# Patient Record
Sex: Female | Born: 1950 | Race: Black or African American | Hispanic: No | Smoking: Never smoker
Health system: Southern US, Community
[De-identification: ages and names within clinical notes are randomized; demographics above are authoritative.]

## PROBLEM LIST (undated history)

## (undated) DIAGNOSIS — E7800 Pure hypercholesterolemia, unspecified: Secondary | ICD-10-CM

## (undated) DIAGNOSIS — M1990 Unspecified osteoarthritis, unspecified site: Secondary | ICD-10-CM

## (undated) HISTORY — PX: NO PAST SURGERIES: SHX2092

---

## 2012-05-09 ENCOUNTER — Encounter (HOSPITAL_COMMUNITY): Payer: Self-pay | Admitting: Anesthesiology

## 2012-05-09 ENCOUNTER — Ambulatory Visit (HOSPITAL_COMMUNITY): Payer: Worker's Compensation | Admitting: Anesthesiology

## 2012-05-09 ENCOUNTER — Ambulatory Visit (HOSPITAL_COMMUNITY)
Admission: AD | Admit: 2012-05-09 | Discharge: 2012-05-09 | Disposition: A | Discharge status: Home / Self Care | Payer: Worker's Compensation | Source: Ambulatory Visit | Attending: Orthopedic Surgery | Admitting: Orthopedic Surgery

## 2012-05-09 ENCOUNTER — Encounter (HOSPITAL_COMMUNITY): Payer: Self-pay | Admitting: *Deleted

## 2012-05-09 ENCOUNTER — Encounter (HOSPITAL_COMMUNITY)
Admission: AD | Disposition: A | Discharge status: Home / Self Care | Payer: Self-pay | Source: Ambulatory Visit | Attending: Orthopedic Surgery

## 2012-05-09 DIAGNOSIS — W07XXXA Fall from chair, initial encounter: Secondary | ICD-10-CM | POA: Insufficient documentation

## 2012-05-09 DIAGNOSIS — Y9229 Other specified public building as the place of occurrence of the external cause: Secondary | ICD-10-CM | POA: Insufficient documentation

## 2012-05-09 DIAGNOSIS — S52502A Unspecified fracture of the lower end of left radius, initial encounter for closed fracture: Secondary | ICD-10-CM

## 2012-05-09 DIAGNOSIS — S52599A Other fractures of lower end of unspecified radius, initial encounter for closed fracture: Secondary | ICD-10-CM | POA: Insufficient documentation

## 2012-05-09 HISTORY — PX: ORIF WRIST FRACTURE: SHX2133

## 2012-05-09 HISTORY — DX: Unspecified osteoarthritis, unspecified site: M19.90

## 2012-05-09 LAB — BASIC METABOLIC PANEL
CO2: 25 mEq/L (ref 19–32)
Glucose, Bld: 97 mg/dL (ref 70–99)
Potassium: 3.7 mEq/L (ref 3.5–5.1)
Sodium: 138 mEq/L (ref 135–145)

## 2012-05-09 LAB — SURGICAL PCR SCREEN: Staphylococcus aureus: NEGATIVE

## 2012-05-09 LAB — CBC
HCT: 40.5 % (ref 36.0–46.0)
Hemoglobin: 13.8 g/dL (ref 12.0–15.0)
RBC: 4.42 MIL/uL (ref 3.87–5.11)

## 2012-05-09 SURGERY — OPEN REDUCTION INTERNAL FIXATION (ORIF) WRIST FRACTURE
Anesthesia: General | Site: Wrist | Laterality: Left | Wound class: Clean

## 2012-05-09 MED ORDER — FENTANYL CITRATE 0.05 MG/ML IJ SOLN
INTRAMUSCULAR | Status: DC | PRN
  Administered 2012-05-09 (×5): 50 ug via INTRAVENOUS

## 2012-05-09 MED ORDER — ACETAMINOPHEN 10 MG/ML IV SOLN
INTRAVENOUS | Status: AC
  Filled 2012-05-09: qty 100

## 2012-05-09 MED ORDER — LACTATED RINGERS IV SOLN
INTRAVENOUS | Status: DC | PRN
  Administered 2012-05-09 (×2): via INTRAVENOUS

## 2012-05-09 MED ORDER — BUPIVACAINE HCL (PF) 0.5 % IJ SOLN
INTRAMUSCULAR | Status: AC
  Filled 2012-05-09: qty 30

## 2012-05-09 MED ORDER — LACTATED RINGERS IV SOLN
INTRAVENOUS | Status: DC
  Administered 2012-05-09: 11:00:00 via INTRAVENOUS

## 2012-05-09 MED ORDER — DEXAMETHASONE SODIUM PHOSPHATE 10 MG/ML IJ SOLN
INTRAMUSCULAR | Status: DC | PRN
  Administered 2012-05-09: 8 mg via INTRAVENOUS

## 2012-05-09 MED ORDER — CEFAZOLIN SODIUM-DEXTROSE 2-3 GM-% IV SOLR
INTRAVENOUS | Status: AC
  Filled 2012-05-09: qty 50

## 2012-05-09 MED ORDER — MIDAZOLAM HCL 5 MG/5ML IJ SOLN
INTRAMUSCULAR | Status: DC | PRN
  Administered 2012-05-09: 2 mg via INTRAVENOUS

## 2012-05-09 MED ORDER — CEFAZOLIN SODIUM-DEXTROSE 2-3 GM-% IV SOLR
2.0000 g | INTRAVENOUS | Status: AC
  Administered 2012-05-09: 2 g via INTRAVENOUS

## 2012-05-09 MED ORDER — OXYCODONE HCL 5 MG/5ML PO SOLN: 5.0000 mg | Freq: Once | ORAL | Status: DC | PRN

## 2012-05-09 MED ORDER — MUPIROCIN 2 % EX OINT
TOPICAL_OINTMENT | CUTANEOUS | Status: AC
  Filled 2012-05-09: qty 22

## 2012-05-09 MED ORDER — OXYCODONE HCL 5 MG PO TABS: 5.0000 mg | ORAL_TABLET | Freq: Once | ORAL | Status: DC | PRN

## 2012-05-09 MED ORDER — ACETAMINOPHEN 10 MG/ML IV SOLN
1000.0000 mg | Freq: Once | INTRAVENOUS | Status: AC
  Administered 2012-05-09: 1000 mg via INTRAVENOUS

## 2012-05-09 MED ORDER — DEXTROSE-NACL 5-0.45 % IV SOLN: INTRAVENOUS | Status: DC

## 2012-05-09 MED ORDER — PROPOFOL 10 MG/ML IV BOLUS
INTRAVENOUS | Status: DC | PRN
  Administered 2012-05-09: 200 mg via INTRAVENOUS

## 2012-05-09 MED ORDER — HYDROMORPHONE HCL PF 1 MG/ML IJ SOLN
INTRAMUSCULAR | Status: AC
  Filled 2012-05-09: qty 1

## 2012-05-09 MED ORDER — ONDANSETRON HCL 4 MG/2ML IJ SOLN
INTRAMUSCULAR | Status: DC | PRN
  Administered 2012-05-09: 4 mg via INTRAVENOUS

## 2012-05-09 MED ORDER — LIDOCAINE HCL (CARDIAC) 20 MG/ML IV SOLN
INTRAVENOUS | Status: DC | PRN
  Administered 2012-05-09: 100 mg via INTRAVENOUS

## 2012-05-09 MED ORDER — METOCLOPRAMIDE HCL 5 MG/ML IJ SOLN: 10.0000 mg | Freq: Once | INTRAMUSCULAR | Status: DC | PRN

## 2012-05-09 MED ORDER — 0.9 % SODIUM CHLORIDE (POUR BTL) OPTIME
TOPICAL | Status: DC | PRN
  Administered 2012-05-09: 1000 mL

## 2012-05-09 MED ORDER — HYDROMORPHONE HCL PF 1 MG/ML IJ SOLN
0.2500 mg | INTRAMUSCULAR | Status: DC | PRN
  Administered 2012-05-09 (×2): 0.5 mg via INTRAVENOUS

## 2012-05-09 MED ORDER — CHLORHEXIDINE GLUCONATE 4 % EX LIQD: 60.0000 mL | Freq: Once | CUTANEOUS | Status: DC

## 2012-05-09 MED ORDER — BUPIVACAINE HCL 0.5 % IJ SOLN
INTRAMUSCULAR | Status: DC | PRN
  Administered 2012-05-09: 7 mL

## 2012-05-09 SURGICAL SUPPLY — 55 items
BANDAGE ELASTIC 3 VELCRO ST LF (GAUZE/BANDAGES/DRESSINGS) IMPLANT
BANDAGE ELASTIC 4 VELCRO ST LF (GAUZE/BANDAGES/DRESSINGS) IMPLANT
BANDAGE GAUZE ELAST BULKY 4 IN (GAUZE/BANDAGES/DRESSINGS) IMPLANT
BENZOIN TINCTURE PRP APPL 2/3 (GAUZE/BANDAGES/DRESSINGS) IMPLANT
BIT DRILL 2 FAST STEP (BIT) ×2 IMPLANT
BIT DRILL 2.5X4 QC (BIT) ×2 IMPLANT
BNDG ELASTIC 2 VLCR STRL LF (GAUZE/BANDAGES/DRESSINGS) IMPLANT
BNDG ESMARK 4X9 LF (GAUZE/BANDAGES/DRESSINGS) ×2 IMPLANT
CLOTH BEACON ORANGE TIMEOUT ST (SAFETY) ×2 IMPLANT
CUFF TOURNIQUET SINGLE 18IN (TOURNIQUET CUFF) IMPLANT
CUFF TOURNIQUET SINGLE 24IN (TOURNIQUET CUFF) IMPLANT
DRAPE OEC MINIVIEW 54X84 (DRAPES) ×2 IMPLANT
DURAPREP 26ML APPLICATOR (WOUND CARE) ×2 IMPLANT
ELECT NEEDLE TIP 2.8 STRL (NEEDLE) ×2 IMPLANT
ELECT REM PT RETURN 9FT ADLT (ELECTROSURGICAL) ×2
ELECTRODE REM PT RTRN 9FT ADLT (ELECTROSURGICAL) ×1 IMPLANT
GAUZE XEROFORM 1X8 LF (GAUZE/BANDAGES/DRESSINGS) ×2 IMPLANT
GLOVE BIO SURGEON STRL SZ7 (GLOVE) ×2 IMPLANT
GLOVE BIO SURGEON STRL SZ7.5 (GLOVE) ×2 IMPLANT
GLOVE BIOGEL PI IND STRL 7.5 (GLOVE) ×1 IMPLANT
GLOVE BIOGEL PI IND STRL 8 (GLOVE) ×1 IMPLANT
GLOVE BIOGEL PI INDICATOR 7.5 (GLOVE) ×1
GLOVE BIOGEL PI INDICATOR 8 (GLOVE) ×1
K-WIRE 1.6 (WIRE) ×2
K-WIRE FX5X1.6XNS BN SS (WIRE) ×2
KIT BASIN OR (CUSTOM PROCEDURE TRAY) ×2 IMPLANT
KIT ROOM TURNOVER OR (KITS) ×2 IMPLANT
KWIRE FX5X1.6XNS BN SS (WIRE) ×2 IMPLANT
NEEDLE 22X1 1/2 (OR ONLY) (NEEDLE) IMPLANT
PACK ORTHO EXTREMITY (CUSTOM PROCEDURE TRAY) ×2 IMPLANT
PAD ARMBOARD 7.5X6 YLW CONV (MISCELLANEOUS) ×4 IMPLANT
PAD CAST 3X4 CTTN HI CHSV (CAST SUPPLIES) IMPLANT
PAD CAST 4YDX4 CTTN HI CHSV (CAST SUPPLIES) IMPLANT
PADDING CAST COTTON 3X4 STRL (CAST SUPPLIES)
PADDING CAST COTTON 4X4 STRL (CAST SUPPLIES)
PADDING UNDERCAST 2  STERILE (CAST SUPPLIES) IMPLANT
PEG FULLY THREADED 2.5X22MM (Peg) ×4 IMPLANT
PEG SUBCHONDRAL SMOOTH 2.0X16 (Peg) ×2 IMPLANT
PEG SUBCHONDRAL SMOOTH 2.0X20 (Peg) ×4 IMPLANT
PEG SUBCHONDRAL SMOOTH 2.0X22 (Peg) ×4 IMPLANT
PLATE SHORT 24.4X51.3 LT (Plate) ×2 IMPLANT
SCREW BN 12X3.5XNS CORT TI (Screw) ×2 IMPLANT
SCREW CORT 3.5X12 (Screw) ×2 IMPLANT
SPONGE GAUZE 4X4 12PLY (GAUZE/BANDAGES/DRESSINGS) ×2 IMPLANT
SPONGE LAP 4X18 X RAY DECT (DISPOSABLE) ×2 IMPLANT
STRIP CLOSURE SKIN 1/2X4 (GAUZE/BANDAGES/DRESSINGS) IMPLANT
SUCTION FRAZIER TIP 10 FR DISP (SUCTIONS) ×2 IMPLANT
SUT ETHILON 4 0 PS 2 18 (SUTURE) IMPLANT
SUT VIC AB 2-0 CTB1 (SUTURE) IMPLANT
SUT VIC AB 3-0 FS2 27 (SUTURE) IMPLANT
SYR CONTROL 10ML LL (SYRINGE) IMPLANT
TOWEL OR 17X24 6PK STRL BLUE (TOWEL DISPOSABLE) ×2 IMPLANT
TOWEL OR 17X26 10 PK STRL BLUE (TOWEL DISPOSABLE) ×2 IMPLANT
TUBE CONNECTING 12X1/4 (SUCTIONS) ×2 IMPLANT
UNDERPAD 30X30 INCONTINENT (UNDERPADS AND DIAPERS) ×2 IMPLANT

## 2012-05-09 NOTE — Progress Notes (Signed)
Orthopedic Tech Progress Note Patient Details:  Ellen Bradshaw 1950/05/18 [id number] Arm sling ordered and delivered to PACU nurse for patient to take home for discharge. Ortho Devices Type of Ortho Device: Arm sling Ortho Device/Splint Interventions: Ordered   [place] R Thompson 05/09/2012, 2:58 PM

## 2012-05-09 NOTE — Anesthesia Procedure Notes (Signed)
Procedure Name: LMA Insertion Date/Time: 05/09/2012 11:33 AM Performed by: Romie Minus K Pre-anesthesia Checklist: Patient identified, Emergency Drugs available, Suction available, Patient being monitored and Timeout performed Patient Re-evaluated:Patient Re-evaluated prior to inductionOxygen Delivery Method: Circle system utilized Preoxygenation: Pre-oxygenation with 100% oxygen Intubation Type: IV induction Ventilation: Mask ventilation without difficulty LMA: LMA inserted LMA Size: 4.0 Number of attempts: 1 Placement Confirmation: positive ETCO2,  CO2 detector and breath sounds checked- equal and bilateral Tube secured with: Tape Dental Injury: Teeth and Oropharynx as per pre-operative assessment

## 2012-05-09 NOTE — Transfer of Care (Signed)
Immediate Anesthesia Transfer of Care Note  Patient: Ellen Bradshaw  Procedure(s) Performed: Procedure(s) (LRB) with comments: OPEN REDUCTION INTERNAL FIXATION (ORIF) WRIST FRACTURE (Left)  Patient Location: PACU  Anesthesia Type:General  Level of Consciousness: oriented, sedated and patient cooperative  Airway & Oxygen Therapy: Patient Spontanous Breathing and Patient connected to nasal cannula oxygen  Post-op Assessment: Report given to PACU RN and Post -op Vital signs reviewed and stable  Post vital signs: Reviewed and stable  Complications: No apparent anesthesia complications

## 2012-05-09 NOTE — H&P (Signed)
Ellen Bradshaw is an 62 y.o. female.   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  She injured her left wrist and low back.  She was seen at urgent care, where xrays showed a distal radius radius fx.    Past Medical Hx: Back pain  Past Surgical Hx: None  Family Hx: DM, HTN, CAD  Social Hx: Denies use of tobacco, occasional alcohol.  No past medical history on file.  No past surgical history on file.  No family history on file. Social History:  does not have a smoking history on file. She does not have any smokeless tobacco history on file. Her alcohol and drug histories not on file.  Allergies: Allergies not on file  No prescriptions prior to admission    No results found for this or any previous visit (from the past 48 hour(s)). No results found.  Review of Systems  Constitutional: Negative.   HENT: Negative.   Eyes: Negative.   Respiratory: Negative.   Cardiovascular: Negative.   Gastrointestinal: Negative.   Genitourinary: Negative.   Musculoskeletal: Positive for back pain and joint pain.  Skin: Negative.   Neurological: Negative.   Endo/Heme/Allergies: Bruises/bleeds easily.  Psychiatric/Behavioral: Negative.     There were no vitals taken for this visit. Physical Exam  Constitutional: She is oriented to person, place, and time. She appears well-developed and well-nourished.  HENT:  Head: Normocephalic.  Cardiovascular: Normal heart sounds.   Respiratory: Breath sounds normal.  GI: Bowel sounds are normal.  Musculoskeletal:       Left wrist: She exhibits decreased range of motion, tenderness, swelling and deformity.  Neurological: She is alert and oriented to person, place, and time.    Xrays: 3 views of the left wrist demonstrate a fx of the distal radius with shortening and 30 degrees apex volar angulation.  Assessment/Plan Left distal radius Fx Plan:  Open reduction  internal fixation of the left wrist was discussed with the patient in detail.  She understands the risks and benefits of surgery and would like to proceed.  Ellen Bradshaw 05/09/2012, 7:36 AM

## 2012-05-09 NOTE — Anesthesia Preprocedure Evaluation (Addendum)
Anesthesia Evaluation  Patient identified by MRN, date of birth, ID band Patient awake    Reviewed: Allergy & Precautions, H&P , NPO status , Patient's Chart, lab work & pertinent test results, reviewed documented beta blocker date and time   History of Anesthesia Complications Negative for: history of anesthetic complications  Airway Mallampati: II TM Distance: >3 FB Neck ROM: full    Dental  (+) Teeth Intact, Partial Lower and Dental Advisory Given   Pulmonary neg pulmonary ROS,  breath sounds clear to auscultation        Cardiovascular negative cardio ROS  Rhythm:regular     Neuro/Psych negative neurological ROS  negative psych ROS   GI/Hepatic negative GI ROS, Neg liver ROS,   Endo/Other  negative endocrine ROS  Renal/GU negative Renal ROS  negative genitourinary   Musculoskeletal   Abdominal   Peds  Hematology negative hematology ROS (+)   Anesthesia Other Findings See surgeon's H&P   Reproductive/Obstetrics negative OB ROS                          Anesthesia Physical Anesthesia Plan  ASA: II  Anesthesia Plan: General   Post-op Pain Management:    Induction: Intravenous  Airway Management Planned: LMA  Additional Equipment:   Intra-op Plan:   Post-operative Plan: Extubation in OR  Informed Consent: I have reviewed the patients History and Physical, chart, labs and discussed the procedure including the risks, benefits and alternatives for the proposed anesthesia with the patient or authorized representative who has indicated his/her understanding and acceptance.   Dental Advisory Given  Plan Discussed with: CRNA and Surgeon  Anesthesia Plan Comments:         Anesthesia Quick Evaluation

## 2012-05-09 NOTE — Op Note (Signed)
05/09/2012  12:34 PM  PATIENT:  Ellen Bradshaw  62 y.o. 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 In order to restore function and decrease pain she is taken for open reduction and internal fixation risks and benefits have been discussed at length with the patient.  Description of procedure: Patient identified by arm band receive preoperative IV antibiotics in the holding area. She was taken to the operating room, the appropriate anesthetic monitors were attached and general LMA anesthesia induced. A tourniquet was applied high to the left forearm and the left upper Charney prepped and draped in the usual sterile fashion from the fingertips to the tourniquet. A timeout procedure was performed. The limb was wrapped with an Esmarch bandage and the tourniquet inflated to 250 mm of mercury. A 10 cm longitudinal insertion starting at the insertion of the flexor carpi radialis tendon and going proximally was made through the skin and subcutaneous tissue down to the sheath over the tendon. The sheath was incised and the right tendon reflected laterally the dorsal sheath was then incised taking [place] down to the pronator quadratus which was then reflected off of its insertion on the lateral edge of the radius. This exposed the fracture and a Chung retractor was placed  to improve visualization. Using a combination of traction and volar angulation the fracture was reduced and C-arm images were taken confirming that near-anatomic reduction. A small left DVR plate was then provisionally fixed to the proximal part of the radius using a 12 mm glide screw , and K wires placed to the into the distal fracture for provisional fixation of the DVR plate. Near-anatomic reduction was confirmed on C-arm imaging. We then placed a combination of screw pegs and fully threaded screws into the distal aspect of the DVR plate obtaining good firm fixation of the distal fragment using a total of 6 screws. 2 more lag screws were placed in the proximal stem of the plate completing the fixation procedure. The tourniquet was let down the wound irrigated with normal saline solution small bleeders were identified and cauterized. We then closed in layers with 3-0 subcutaneous running Vicryl suture, 3-0 subcuticular running Vicryl suture. A dressing of Xerofoam 4 x 4 dressing sponges web roll Ace wrap and a Velfoam splint were then applied. The patient was awakened extubated and taken to the recovery without difficulty. The plan is for same-day discharge.  [name] 05/09/2012, 12:34 PM

## 2012-05-09 NOTE — Preoperative (Signed)
Beta Blockers   Reason not to administer Beta Blockers:Not Applicable 

## 2012-05-09 NOTE — Anesthesia Postprocedure Evaluation (Signed)
Anesthesia Post Note  Patient: Ellen Bradshaw  Procedure(s) Performed: Procedure(s) (LRB): OPEN REDUCTION INTERNAL FIXATION (ORIF) WRIST FRACTURE (Left)  Anesthesia type: general  Patient location: PACU  Post pain: Pain level controlled  Post assessment: Patient's Cardiovascular Status Stable  Last Vitals:  Filed Vitals:   05/09/12 1444  BP:   Pulse:   Temp: 36.7 C  Resp:     Post vital signs: Reviewed and stable  Level of consciousness: sedated  Complications: No apparent anesthesia complications

## 2012-05-09 NOTE — Interval H&P Note (Signed)
History and Physical Interval Note:  05/09/2012 11:10 AM  Ellen Bradshaw  has presented today for surgery, with the diagnosis of left distal radius fracture  The various methods of treatment have been discussed with the patient and family. After consideration of risks, benefits and other options for treatment, the patient has consented to  Procedure(s) (LRB) with comments: OPEN REDUCTION INTERNAL FIXATION (ORIF) WRIST FRACTURE (Left) as a surgical intervention .  The patient's history has been reviewed, patient examined, no change in status, stable for surgery.  I have reviewed the patient's chart and labs.  Questions were answered to the patient's satisfaction.     [name]

## 2012-05-12 ENCOUNTER — Encounter (HOSPITAL_COMMUNITY): Payer: Self-pay | Admitting: Orthopedic Surgery

## 2015-05-16 ENCOUNTER — Encounter (HOSPITAL_BASED_OUTPATIENT_CLINIC_OR_DEPARTMENT_OTHER): Payer: Self-pay | Admitting: *Deleted

## 2015-05-16 ENCOUNTER — Emergency Department (HOSPITAL_BASED_OUTPATIENT_CLINIC_OR_DEPARTMENT_OTHER)
Admission: EM | Admit: 2015-05-16 | Discharge: 2015-05-16 | Disposition: A | Discharge status: Home / Self Care | Payer: BC Managed Care – PPO | Attending: Emergency Medicine | Admitting: Emergency Medicine

## 2015-05-16 DIAGNOSIS — R21 Rash and other nonspecific skin eruption: Secondary | ICD-10-CM | POA: Insufficient documentation

## 2015-05-16 NOTE — ED Notes (Signed)
Notified by reg clerk that pt stated rash was better and she was leaving

## 2015-05-16 NOTE — ED Notes (Signed)
Pt began having itching rash on back and today at work her itching got worse and she has hives on her neck, face, chest and back as well as face.  Pt is unsure what could be causing this, no change in soap or detergent recently.

## 2017-04-12 ENCOUNTER — Emergency Department (HOSPITAL_BASED_OUTPATIENT_CLINIC_OR_DEPARTMENT_OTHER)
Admission: EM | Admit: 2017-04-12 | Discharge: 2017-04-12 | Disposition: A | Discharge status: Home / Self Care | Payer: Worker's Compensation | Attending: Emergency Medicine | Admitting: Emergency Medicine

## 2017-04-12 ENCOUNTER — Encounter (HOSPITAL_BASED_OUTPATIENT_CLINIC_OR_DEPARTMENT_OTHER): Payer: Self-pay | Admitting: Emergency Medicine

## 2017-04-12 ENCOUNTER — Emergency Department (HOSPITAL_BASED_OUTPATIENT_CLINIC_OR_DEPARTMENT_OTHER): Payer: Worker's Compensation

## 2017-04-12 ENCOUNTER — Other Ambulatory Visit: Payer: Self-pay

## 2017-04-12 DIAGNOSIS — R51 Headache: Secondary | ICD-10-CM | POA: Diagnosis not present

## 2017-04-12 DIAGNOSIS — W19XXXA Unspecified fall, initial encounter: Secondary | ICD-10-CM

## 2017-04-12 DIAGNOSIS — M25552 Pain in left hip: Secondary | ICD-10-CM | POA: Insufficient documentation

## 2017-04-12 DIAGNOSIS — M25512 Pain in left shoulder: Secondary | ICD-10-CM | POA: Insufficient documentation

## 2017-04-12 DIAGNOSIS — R519 Headache, unspecified: Secondary | ICD-10-CM

## 2017-04-12 HISTORY — DX: Pure hypercholesterolemia, unspecified: E78.00

## 2017-04-12 MED ORDER — ONDANSETRON 4 MG PO TBDP
4.0000 mg | ORAL_TABLET | Freq: Once | ORAL | Status: AC
  Administered 2017-04-12: 4 mg via ORAL
  Filled 2017-04-12: qty 1

## 2017-04-12 MED ORDER — ACETAMINOPHEN 325 MG PO TABS
650.0000 mg | ORAL_TABLET | Freq: Once | ORAL | Status: AC
  Administered 2017-04-12: 650 mg via ORAL
  Filled 2017-04-12: qty 2

## 2017-04-12 MED ORDER — METHOCARBAMOL 500 MG PO TABS: 500.0000 mg | ORAL_TABLET | Freq: Two times a day (BID) | ORAL | 0 refills | Status: AC

## 2017-04-12 NOTE — Discharge Instructions (Signed)
Your imaging is obtained in the ED today was reassuring.  No signs of fractured bones or bleeding in your head.  You may have sustained a concussion with your fall. Concussion Hotline [phone]).  Have also given you a referral to orthopedic if your left hip and left shoulder pain is not improving.  Symptomatic treatment with NSAIDs including Aleve or ibuprofen.  May also take Tylenol.  Alternate warm and cold compresses to the area. Please rest, ice, compress and elevated the affected body part to help with swelling and pain. Please the Robaxin for muscle relaxation. This medication will make you drowsy so avoid situation that could place you in danger.    You have been diagnosed with a concussion.  Ibuprofen or Tylenol for pain Rest, ice on head.  Stay in a quiet, non-simulating, dark environment.  Follow up with your primary care physician if headache persists.  Return to the emergency department if patient becomes lethargic, begins vomiting or other change in mental status.  HEAD INJURY If any of the following occur notify your physician or go to the Hospital Emergency Department:  Increased drowsiness, stupor or loss of consciousness  Temperature above 100 F  Vomiting  Severe headache  Blood or clear fluid dripping from the nose or ears  Stiffness of the neck  Dizziness or blurred vision  Any other unusual symptoms  PRECAUTIONS  Keep head elevated at all times for the first 24 hours (Elevate mattress if pillow is ineffective)  Do not take sedatives, narcotics or alcohol  Avoid aspirin. Use only acetaminophen (e.g. Tylenol) or ibuprofen (e.g. Advil) for relief of pain. Follow directions on the bottle for dosage.  Use ice packs for comfort

## 2017-04-12 NOTE — ED Provider Notes (Signed)
[facility] EMERGENCY DEPARTMENT Provider Note   CSN: [id number] Arrival date & time: 04/12/17  1033     History   Chief Complaint Chief Complaint  Patient presents with  . Fall    HPI Ellen Bradshaw is a 67 y.o. female.  HPI 67 year old African-American female with no pertinent past medical history presents to the emergency department today for evaluation of left shoulder pain and left hip pain along with headache following mechanical fall prior to arrival.  Patient states that she was walking into work when she tripped on the water and fell onto her left shoulder.  She also reports hitting her left hip on the ground.  Patient denies head injury or LOC.  She states the pain in her left shoulder is worse with palpation and range of motion.  Patient reports pain in her left hip with palpation but is able to ambulate with normal gait and without pain.  Patient denies any associated knee pain, abdominal pain, chest pain.  She does report some left lateral neck pain.  She reports a headache and feeling "foggy".  She denies any lightheadedness, dizziness, LOC, visual changes, photophobia.  She does report some mild nausea but denies he is not take anything for the pain prior to arrival.  Nothing makes her symptoms better. Past Medical History:  Diagnosis Date  . Arthritis   . Hypercholesteremia     There are no active problems to display for this patient.   Past Surgical History:  Procedure Laterality Date  . NO PAST SURGERIES    . ORIF WRIST FRACTURE  05/09/2012   Procedure: OPEN REDUCTION INTERNAL FIXATION (ORIF) WRIST FRACTURE;  Surgeon: [name], MD;  Location: [facility] OR;  Service: Orthopedics;  Laterality: Left;    OB History    No data available       Home Medications    Prior to Admission medications   Medication Sig Start Date End Date Taking? Authorizing Provider  meloxicam (MOBIC) 15 MG tablet Take 15 mg by mouth at bedtime as needed for pain.   Yes  [provider]    Family History No family history on file.  Social History Social History   Tobacco Use  . Smoking status: Never Smoker  . Smokeless tobacco: Never Used  Substance Use Topics  . Alcohol use: Yes  . Drug use: No     Allergies   Patient has no known allergies.   Review of Systems Review of Systems  Constitutional: Negative for chills and fever.  Eyes: Negative for photophobia and visual disturbance.  Respiratory: Negative for shortness of breath.   Cardiovascular: Negative for chest pain.  Gastrointestinal: Positive for nausea. Negative for abdominal pain and vomiting.  Musculoskeletal: Positive for arthralgias, joint swelling, myalgias, neck pain and neck stiffness. Negative for back pain and gait problem.  Skin: Negative for color change and rash.  Neurological: Positive for headaches. Negative for dizziness, weakness, light-headedness and numbness.     Physical Exam Updated Vital Signs BP (!) 164/82 (BP Location: Right Arm)   Pulse 62   Temp 98.7 F (37.1 C) (Oral)   Resp 18   Ht 5\' 2"  (1.575 m)   Wt 83.9 kg (185 lb)   SpO2 100%   BMI 33.84 kg/m   Physical Exam Physical Exam  Constitutional: Pt is oriented to person, place, and time. Appears well-developed and well-nourished. No distress.  HENT:  Head: Normocephalic and atraumatic.  No raccoon eyes or battle sign. Ears: No bilateral  hemotympanum. Nose: Nose normal. No septal hematoma. Mouth/Throat: Uvula is midline, oropharynx is clear and moist and mucous membranes are normal.  Eyes: Conjunctivae and EOM are normal. Pupils are equal, round, and reactive to light.  Neck: No spinous process tenderness and no muscular tenderness present. No rigidity. Normal range of motion present.  Full ROM without pain No midline cervical tenderness No crepitus, deformity or step-offs Left paraspinal tenderness  Cardiovascular: Normal rate, regular rhythm and intact distal pulses.   Pulses:       Radial pulses are 2+ on the right side, and 2+ on the left side.       Dorsalis pedis pulses are 2+ on the right side, and 2+ on the left side.       Posterior tibial pulses are 2+ on the right side, and 2+ on the left side.  Pulmonary/Chest: Effort normal and breath sounds normal. No accessory muscle usage. No respiratory distress. No decreased breath sounds. No wheezes. No rhonchi. No rales. Exhibits no tenderness and no bony tenderness.  No flail segment, crepitus or deformity Equal chest expansion  Abdominal: Soft. Normal appearance and bowel sounds are normal. There is no tenderness. There is no rigidity, no guarding and no CVA tenderness.  Abd soft and nontender  Musculoskeletal: Normal range of motion.       Thoracic back: Exhibits normal range of motion.       Lumbar back: Exhibits normal range of motion.  Full range of motion of the T-spine and L-spine No tenderness to palpation of the spinous processes of the T-spine or L-spine No crepitus, deformity or step-offs No tenderness to palpation of the paraspinous muscles of the L-spine  Tenderness to palpation of the left shoulder with some pain with range of motion however she does have full range of motion.  Do not appreciate any ecchymosis, edema, erythema, warmth, deformity.  Patient has full range of motion of all joints of the left arm without any pain.  Radial pulses are 2+ bilaterally.  Sensation intact.  Brisk cap refill.  Grip strength is normal.  Axillary nerve sensation is intact. Pelvis is stable.  Patient does have some pain with palpation of the left SI joint.  Obvious ecchymosis, edema, erythema.  No lower extremity edema.  Full range of motion of all joints of the left lower extremity.  No shortening or rotation of the lower extremities.  Able to ambulate with normal gait.  DP pulses are 2+ bilaterally.  Sensation intact.  Cap refill is normal. Lymphadenopathy:    Pt has no cervical adenopathy.  Neurological: Pt is alert  and oriented to person, place, and time. Normal reflexes. No cranial nerve deficit. GCS eye subscore is 4. GCS verbal subscore is 5. GCS motor subscore is 6.  Reflex Scores:      Bicep reflexes are 2+ on the right side and 2+ on the left side.      Brachioradialis reflexes are 2+ on the right side and 2+ on the left side.      Patellar reflexes are 2+ on the right side and 2+ on the left side.      Achilles reflexes are 2+ on the right side and 2+ on the left side. Speech is clear and goal oriented, follows commands Normal 5/5 strength in upper and lower extremities bilaterally including dorsiflexion and plantar flexion, strong and equal grip strength Sensation normal to light and sharp touch Moves extremities without ataxia, coordination intact No Clonus  Skin: Skin is warm  and dry. No rash noted. Pt is not diaphoretic. No erythema.  Psychiatric: Normal mood and affect.  Nursing note and vitals reviewed.     ED Treatments / Results  Labs (all labs ordered are listed, but only abnormal results are displayed) Labs Reviewed - No data to display  EKG  EKG Interpretation None       Radiology Ct Head Wo Contrast  Result Date: 04/12/2017 CLINICAL DATA:  Headache and neck pain after fall. EXAM: CT HEAD WITHOUT CONTRAST CT CERVICAL SPINE WITHOUT CONTRAST TECHNIQUE: Multidetector CT imaging of the head and cervical spine was performed following the standard protocol without intravenous contrast. Multiplanar CT image reconstructions of the cervical spine were also generated. COMPARISON:  None. FINDINGS: CT HEAD FINDINGS Brain: No evidence of acute infarction, hemorrhage, hydrocephalus, extra-axial collection or mass lesion/mass effect. Vascular: No hyperdense vessel or unexpected calcification. Skull: Normal. Negative for fracture or focal lesion. Sinuses/Orbits: No acute finding. Other: None. CT CERVICAL SPINE FINDINGS Alignment: Reversal of the normal cervical lordosis. No traumatic  malalignment. Skull base and vertebrae: No acute fracture. No primary bone lesion or focal pathologic process. Congenital nonunion of the posterior arch of C1. Soft tissues and spinal canal: No prevertebral fluid or swelling. No visible canal hematoma. Disc levels: Mild disc height loss at C4-C5, with ankylosis of the left C4-C5 facets. Mild uncovertebral hypertrophy at C4-C5 and C5-C6. Mild neuroforaminal stenosis on the left at C4-C5 and on the right at C5-C6. Upper chest: Negative. Other: None. IMPRESSION: 1.  No acute intracranial abnormality. 2. No acute cervical spine fracture. Mild degenerative changes of the cervical spine. Electronically Signed   By: Obie DredgeWilliam T Derry M.D.   On: 04/12/2017 11:57   Ct Cervical Spine Wo Contrast  Result Date: 04/12/2017 CLINICAL DATA:  Headache and neck pain after fall. EXAM: CT HEAD WITHOUT CONTRAST CT CERVICAL SPINE WITHOUT CONTRAST TECHNIQUE: Multidetector CT imaging of the head and cervical spine was performed following the standard protocol without intravenous contrast. Multiplanar CT image reconstructions of the cervical spine were also generated. COMPARISON:  None. FINDINGS: CT HEAD FINDINGS Brain: No evidence of acute infarction, hemorrhage, hydrocephalus, extra-axial collection or mass lesion/mass effect. Vascular: No hyperdense vessel or unexpected calcification. Skull: Normal. Negative for fracture or focal lesion. Sinuses/Orbits: No acute finding. Other: None. CT CERVICAL SPINE FINDINGS Alignment: Reversal of the normal cervical lordosis. No traumatic malalignment. Skull base and vertebrae: No acute fracture. No primary bone lesion or focal pathologic process. Congenital nonunion of the posterior arch of C1. Soft tissues and spinal canal: No prevertebral fluid or swelling. No visible canal hematoma. Disc levels: Mild disc height loss at C4-C5, with ankylosis of the left C4-C5 facets. Mild uncovertebral hypertrophy at C4-C5 and C5-C6. Mild neuroforaminal stenosis  on the left at C4-C5 and on the right at C5-C6. Upper chest: Negative. Other: None. IMPRESSION: 1.  No acute intracranial abnormality. 2. No acute cervical spine fracture. Mild degenerative changes of the cervical spine. Electronically Signed   By: Obie DredgeWilliam T Derry M.D.   On: 04/12/2017 11:57   Dg Shoulder Left  Result Date: 04/12/2017 CLINICAL DATA:  Left shoulder pain after fall today. EXAM: LEFT SHOULDER - 2+ VIEW COMPARISON:  None. FINDINGS: There is no evidence of fracture or dislocation. There is no evidence of arthropathy or other focal bone abnormality. Soft tissues are unremarkable. IMPRESSION: Normal left shoulder. Electronically Signed   By: Lupita RaiderJames  Green Jr, M.D.   On: 04/12/2017 12:00   Dg Hip Unilat W Or Wo Pelvis 2-3 Views  Left  Result Date: 04/12/2017 CLINICAL DATA:  Left hip pain after fall today. EXAM: DG HIP (WITH OR WITHOUT PELVIS) 2-3V LEFT COMPARISON:  None. FINDINGS: There is no evidence of hip fracture or dislocation. There is no evidence of arthropathy or other focal bone abnormality. IMPRESSION: Normal left hip. Electronically Signed   By: Lupita Raider, M.D.   On: 04/12/2017 12:01    Procedures Procedures (including critical care time)  Medications Ordered in ED Medications  ondansetron (ZOFRAN-ODT) disintegrating tablet 4 mg (4 mg Oral Given 04/12/17 1129)  acetaminophen (TYLENOL) tablet 650 mg (650 mg Oral Given 04/12/17 1130)     Initial Impression / Assessment and Plan / ED Course  I have reviewed the triage vital signs and the nursing notes.  Pertinent labs & imaging results that were available during my care of the patient were reviewed by me and considered in my medical decision making (see chart for details).     Patient presents to the ED for evaluation of headache, left shoulder pain, left hip pain after mechanical fall prior to arrival.  Patient states that she slipped on water.  She denies head injury or LOC but does report some headache and feeling  "foggy".  Patient has not taking for pain prior to arrival.  Patient is neurovascularly intact in all extremities.  She has no lower extremity rotation or shortening.  She has no focal neuro deficits on exam.  Imaging was obtained that shows no acute abnormalities.  Patient symptoms may be consistent with a possible concussion.  Patient has no focal neuro deficit.  She was watched in the ED and has no worsening symptoms.  Patient has no intractable vomiting or vision changes.  Headache has resolved with some Tylenol.  Instructed the patient on very strict return precautions for possible concussion will give her concussion hotline number.  Discussed postconcussive syndrome with patient as well.  Regular follow-up with orthopedics for worsening left shoulder and left hip pain.  Patient is ambulatory in the left lower extremity.  Low suspicion for occult fracture.  She has no other signs of trauma.  Encourage symptomatic treatment at home and follow-up with primary care and orthopedics.  Pt is hemodynamically stable, in NAD, & able to ambulate in the ED. Evaluation does not show pathology that would require ongoing emergent intervention or inpatient treatment. I explained the diagnosis to the patient. Pain has been managed & has no complaints prior to dc. Pt is comfortable with above plan and is stable for discharge at this time. All questions were answered prior to disposition. Strict return precautions for f/u to the ED were discussed. Encouraged follow up with PCP.   Final Clinical Impressions(s) / ED Diagnoses   Final diagnoses:  Fall, initial encounter  Acute nonintractable headache, unspecified headache type  Acute pain of left shoulder  Left hip pain    ED Discharge Orders        Ordered    methocarbamol (ROBAXIN) 500 MG tablet  2 times daily     04/12/17 1230       Wallace Keller 04/12/17 1232    Maia Plan, MD 04/12/17 2001

## 2017-04-12 NOTE — ED Triage Notes (Signed)
Pt slipped at work and fell.  Pt c/o left neck, shoulder, arm, and leg pain.  Denies head injury but states she has headache and feels foggy. Pt was sent by occupational health to be seen in ED.

## 2018-08-16 IMAGING — CT CT HEAD W/O CM
3 of 7 series · 12 of 47 positions shown, 14 images · non-contrast
Comparison: None.

CLINICAL DATA: Headache and neck pain after fall.

EXAM:
CT HEAD WITHOUT CONTRAST
CT CERVICAL SPINE WITHOUT CONTRAST
TECHNIQUE: Multidetector CT imaging of the head and cervical spine was
performed following the standard protocol without intravenous
contrast. Multiplanar CT image reconstructions of the cervical spine
were also generated.

[Series 4: head 3.0 mpr cor · coronal · 0.31mm/px · 3 of 67 slices shown]
[im 19/67  brain]
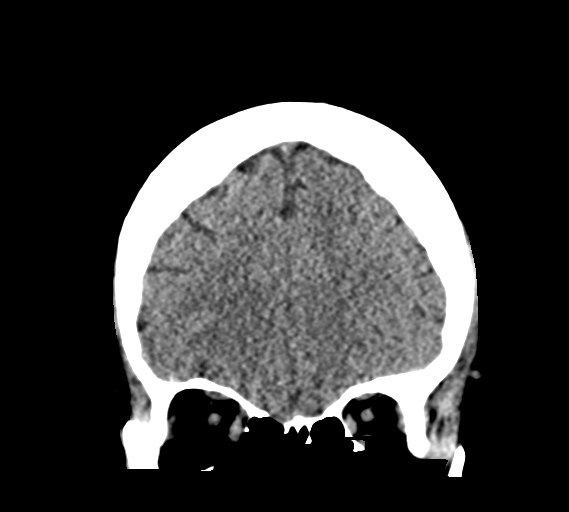
[im 29/67  brain]
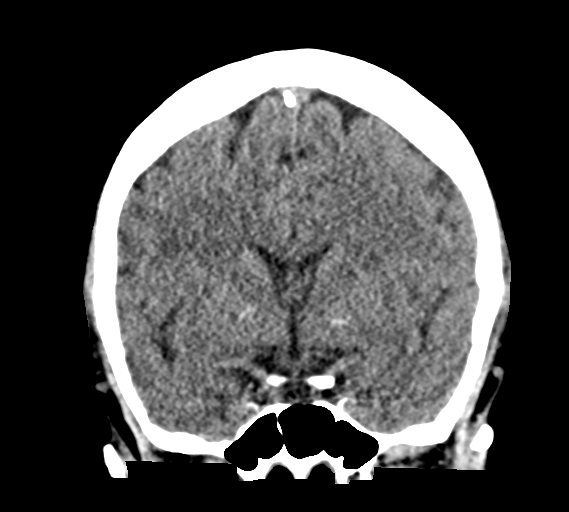
[im 38/67  brain]
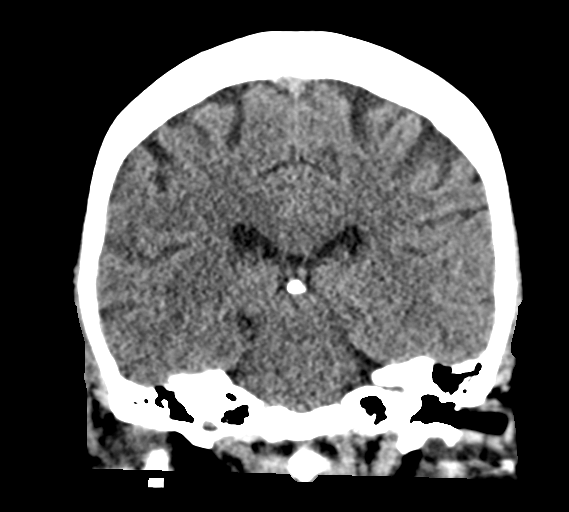

[Series 10: sagittals · sagittal · 0.20mm/px · 2 of 65 slices shown]
[im 22/65  brain]
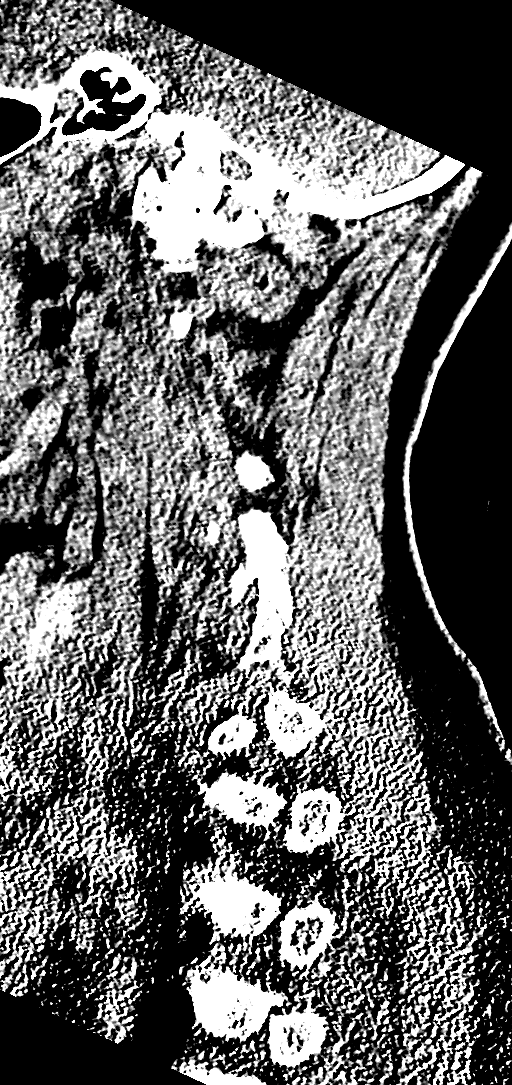
[im 43/65  brain]
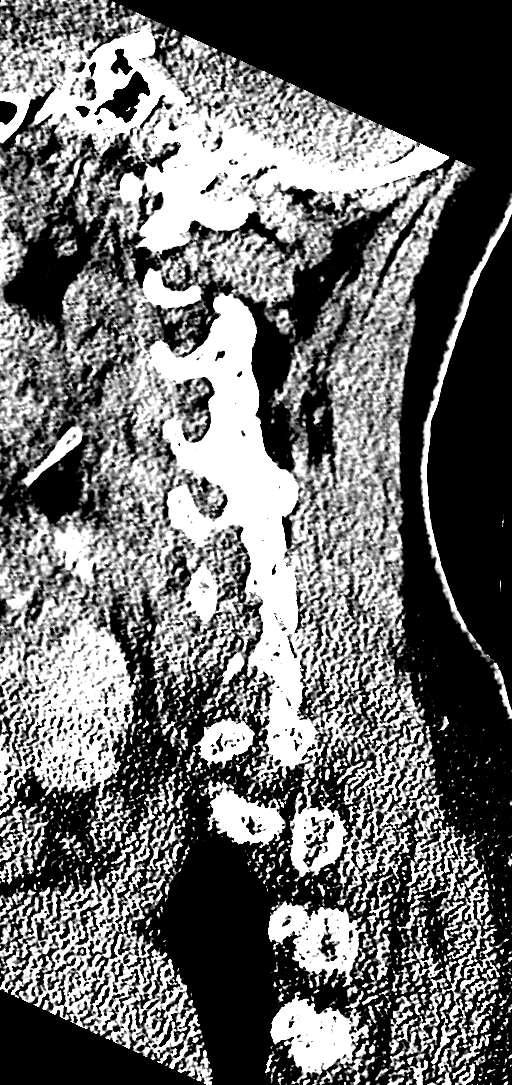

[Series 11: orthogonals · axial · 0.20mm/px · z∈[-297,-134]mm · 7 of 108 slices shown, 9 images]
[im 9/108  brain]
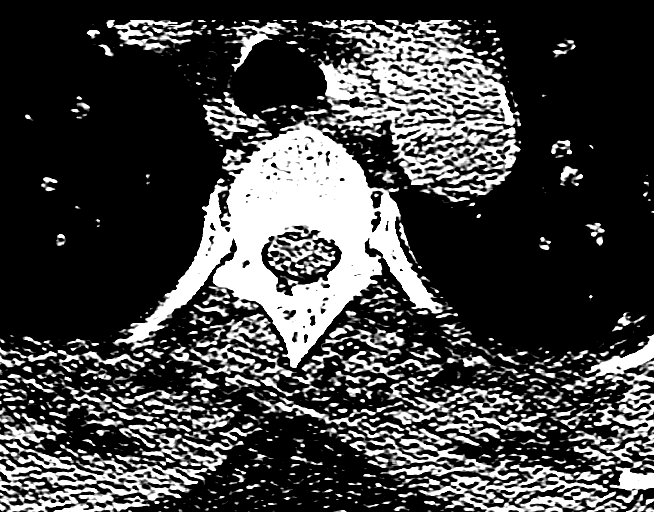
[im 9/108  bone]
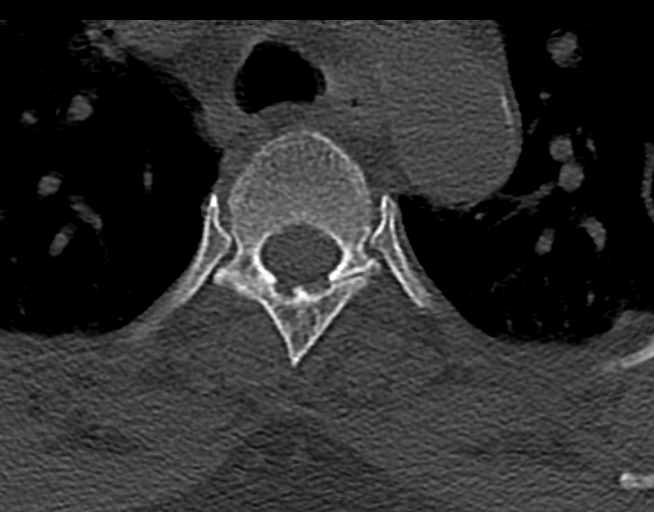
[im 25/108  brain]
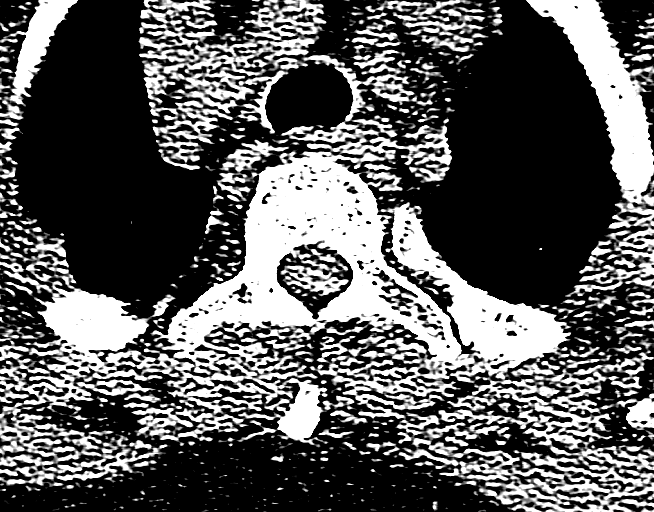
[im 42/108  brain]
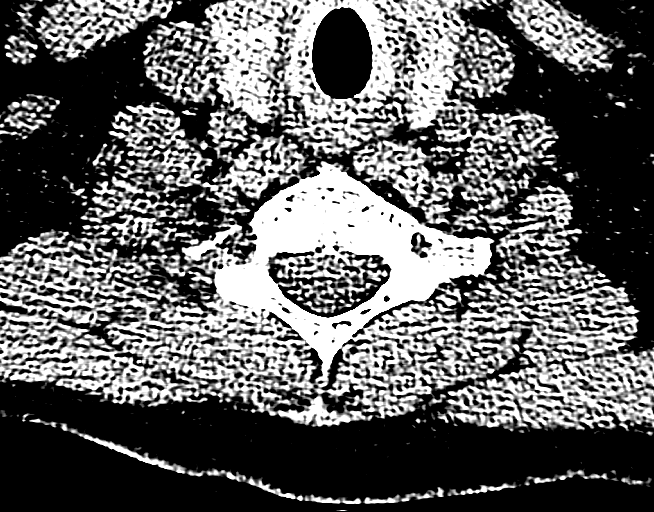
[im 58/108  brain]
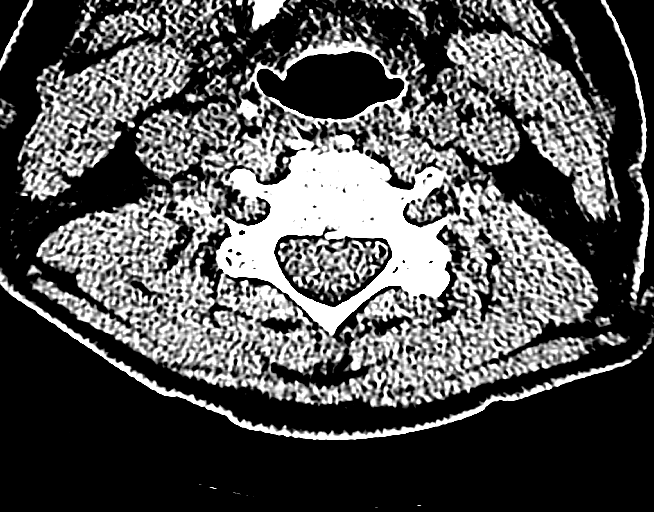
[im 66/108  brain]
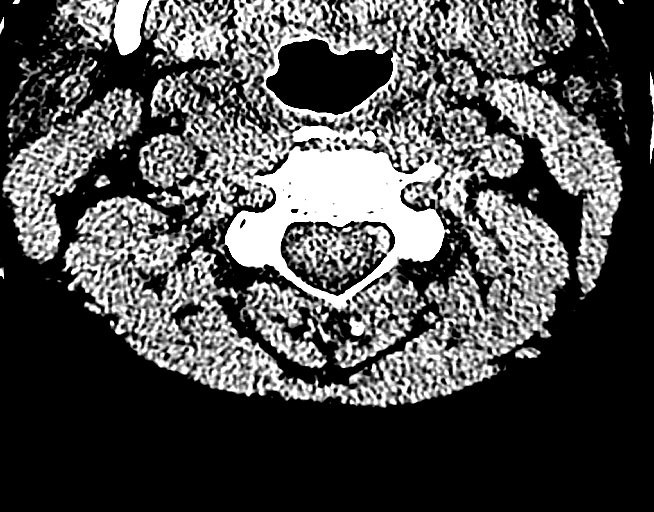
[im 66/108  bone]
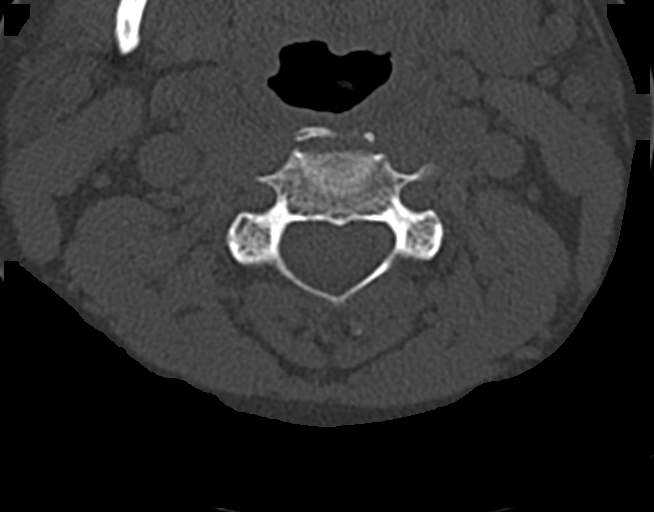
[im 83/108  brain]
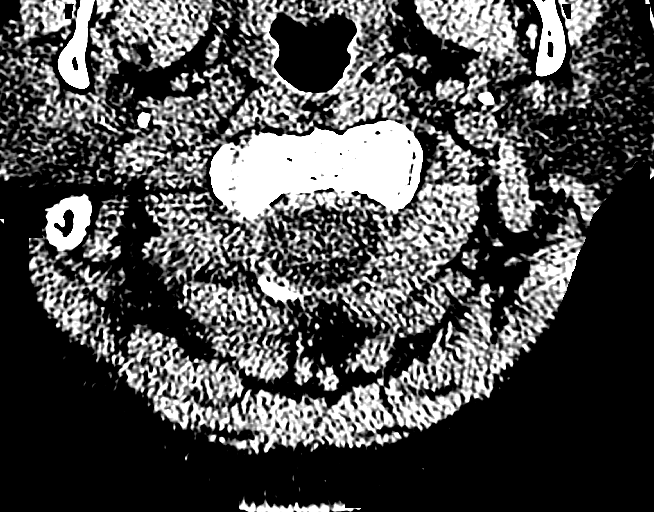
[im 99/108  brain]
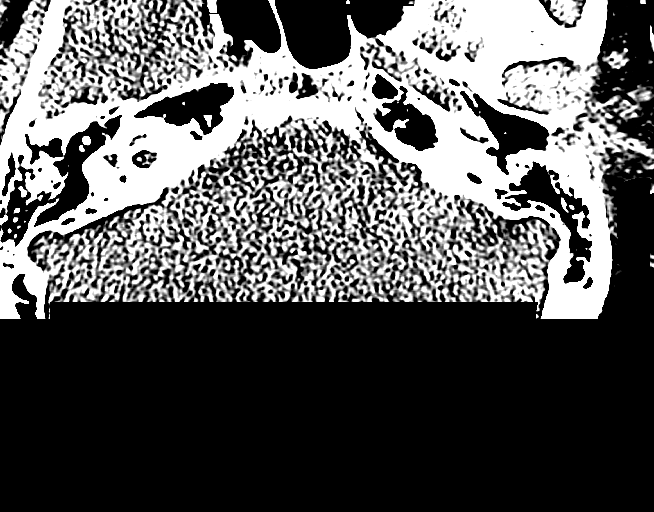

[12 of 47 positions shown; findings below may reference images not displayed]

FINDINGS: CT HEAD FINDINGS

Brain: No evidence of acute infarction, hemorrhage, hydrocephalus,
extra-axial collection or mass lesion/mass effect.

Vascular: No hyperdense vessel or unexpected calcification.

Skull: Normal. Negative for fracture or focal lesion.

Sinuses/Orbits: No acute finding.

Other: None.

CT CERVICAL SPINE FINDINGS

Alignment: Reversal of the normal cervical lordosis. No traumatic
malalignment.

Skull base and vertebrae: No acute fracture. No primary bone lesion
or focal pathologic process. Congenital nonunion of the posterior
arch of C1.

Soft tissues and spinal canal: No prevertebral fluid or swelling. No
visible canal hematoma.

Disc levels: Mild disc height loss at C4-C5, with ankylosis of the
left C4-C5 facets. Mild uncovertebral hypertrophy at C4-C5 and
C5-C6. Mild neuroforaminal stenosis on the left at C4-C5 and on the
right at C5-C6.

Upper chest: Negative.

Other: None.
IMPRESSION: 1.  No acute intracranial abnormality.
2. No acute cervical spine fracture. Mild degenerative changes of
the cervical spine.
